# Patient Record
Sex: Female | Born: 1965 | Race: White | Hispanic: No | Marital: Single
Health system: Southern US, Community
[De-identification: ages and names within clinical notes are randomized; demographics above are authoritative.]

## PROBLEM LIST (undated history)

## (undated) DIAGNOSIS — E079 Disorder of thyroid, unspecified: Secondary | ICD-10-CM

## (undated) DIAGNOSIS — I10 Essential (primary) hypertension: Secondary | ICD-10-CM

---

## 2009-10-28 ENCOUNTER — Ambulatory Visit: Payer: Self-pay | Admitting: Radiology

## 2009-10-28 ENCOUNTER — Emergency Department (HOSPITAL_BASED_OUTPATIENT_CLINIC_OR_DEPARTMENT_OTHER): Admission: EM | Admit: 2009-10-28 | Discharge: 2009-10-28 | Payer: Self-pay | Admitting: Emergency Medicine

## 2020-04-25 ENCOUNTER — Other Ambulatory Visit: Payer: Self-pay

## 2020-07-20 ENCOUNTER — Other Ambulatory Visit: Payer: Self-pay

## 2020-07-20 ENCOUNTER — Emergency Department (HOSPITAL_BASED_OUTPATIENT_CLINIC_OR_DEPARTMENT_OTHER): Payer: Self-pay

## 2020-07-20 ENCOUNTER — Encounter (HOSPITAL_BASED_OUTPATIENT_CLINIC_OR_DEPARTMENT_OTHER): Payer: Self-pay | Admitting: Emergency Medicine

## 2020-07-20 ENCOUNTER — Emergency Department (HOSPITAL_BASED_OUTPATIENT_CLINIC_OR_DEPARTMENT_OTHER)
Admission: EM | Admit: 2020-07-20 | Discharge: 2020-07-20 | Disposition: A | Discharge status: Home / Self Care | Payer: Self-pay | Attending: Emergency Medicine | Admitting: Emergency Medicine

## 2020-07-20 DIAGNOSIS — Z79899 Other long term (current) drug therapy: Secondary | ICD-10-CM | POA: Insufficient documentation

## 2020-07-20 DIAGNOSIS — R079 Chest pain, unspecified: Secondary | ICD-10-CM | POA: Insufficient documentation

## 2020-07-20 DIAGNOSIS — H538 Other visual disturbances: Secondary | ICD-10-CM | POA: Insufficient documentation

## 2020-07-20 DIAGNOSIS — R519 Headache, unspecified: Secondary | ICD-10-CM | POA: Insufficient documentation

## 2020-07-20 DIAGNOSIS — I10 Essential (primary) hypertension: Secondary | ICD-10-CM | POA: Insufficient documentation

## 2020-07-20 HISTORY — DX: Disorder of thyroid, unspecified: E07.9

## 2020-07-20 HISTORY — DX: Essential (primary) hypertension: I10

## 2020-07-20 LAB — BASIC METABOLIC PANEL
Anion gap: 12 (ref 5–15)
BUN: 14 mg/dL (ref 6–20)
CO2: 29 mmol/L (ref 22–32)
Calcium: 9.4 mg/dL (ref 8.9–10.3)
Chloride: 95 mmol/L — ABNORMAL LOW (ref 98–111)
Creatinine, Ser: 0.75 mg/dL (ref 0.44–1.00)
GFR, Estimated: >60 mL/min (ref >60)
Glucose, Bld: 106 mg/dL — ABNORMAL HIGH (ref 70–99)
Potassium: 3.2 mmol/L — ABNORMAL LOW (ref 3.5–5.1)
Sodium: 136 mmol/L (ref 135–145)

## 2020-07-20 LAB — CBC
HCT: 41.6 % (ref 36.0–46.0)
Hemoglobin: 14.8 g/dL (ref 12.0–15.0)
MCH: 31.4 pg (ref 26.0–34.0)
MCHC: 35.6 g/dL (ref 30.0–36.0)
MCV: 88.1 fL (ref 80.0–100.0)
Platelets: 311 10*3/uL (ref 150–400)
RBC: 4.72 MIL/uL (ref 3.87–5.11)
RDW: 12.7 % (ref 11.5–15.5)
WBC: 7.8 10*3/uL (ref 4.0–10.5)
nRBC: 0 % (ref 0.0–0.2)

## 2020-07-20 LAB — TROPONIN I (HIGH SENSITIVITY)
Troponin I (High Sensitivity): 7 ng/L (ref <18)
Troponin I (High Sensitivity): 7 ng/L (ref <18)

## 2020-07-20 MED ORDER — KETOROLAC TROMETHAMINE 15 MG/ML IJ SOLN
15.0000 mg | Freq: Once | INTRAMUSCULAR | Status: AC
  Administered 2020-07-20: 15 mg via INTRAVENOUS
  Filled 2020-07-20: qty 1

## 2020-07-20 MED ORDER — ACETAMINOPHEN 500 MG PO TABS
1000.0000 mg | ORAL_TABLET | Freq: Once | ORAL | Status: AC
  Administered 2020-07-20: 1000 mg via ORAL
  Filled 2020-07-20: qty 2

## 2020-07-20 MED ORDER — DIPHENHYDRAMINE HCL 50 MG/ML IJ SOLN
12.5000 mg | Freq: Once | INTRAMUSCULAR | Status: AC
  Administered 2020-07-20: 12.5 mg via INTRAVENOUS
  Filled 2020-07-20: qty 1

## 2020-07-20 MED ORDER — PROCHLORPERAZINE EDISYLATE 10 MG/2ML IJ SOLN
10.0000 mg | Freq: Once | INTRAMUSCULAR | Status: AC
  Administered 2020-07-20: 10 mg via INTRAVENOUS
  Filled 2020-07-20: qty 2

## 2020-07-20 MED ORDER — LORAZEPAM 1 MG PO TABS
1.0000 mg | ORAL_TABLET | Freq: Once | ORAL | Status: AC
  Administered 2020-07-20: 1 mg via ORAL
  Filled 2020-07-20: qty 1

## 2020-07-20 MED ORDER — IOHEXOL 350 MG/ML SOLN
100.0000 mL | Freq: Once | INTRAVENOUS | Status: AC | PRN
  Administered 2020-07-20: 100 mL via INTRAVENOUS

## 2020-07-20 NOTE — ED Notes (Signed)
ED Provider at bedside. 

## 2020-07-20 NOTE — ED Notes (Signed)
Patient transported to CT 

## 2020-07-20 NOTE — ED Notes (Signed)
Pt also c/o headache (pressure) 8/10 x 1 week and cp x 1 week 5/10

## 2020-07-20 NOTE — ED Notes (Signed)
  Pt transported to ct 

## 2020-07-20 NOTE — ED Triage Notes (Signed)
Reports central chest pressure for the last week.  Reports having a lot of anxiety.  Also reports blurred vision today while she was working on the computer.

## 2020-07-20 NOTE — ED Notes (Signed)
Pt up to restroom.

## 2020-07-20 NOTE — ED Notes (Signed)
Pt via pov from home with blurred vision today. Pt states she has been under a large amount of stress and has had what may be panic attacks as well. Pt A&O x 4, nad noted.

## 2020-07-20 NOTE — ED Provider Notes (Signed)
Carmen Wood   CSN: [id number] Arrival date & time: 07/20/20  1501     History Chief Complaint  Patient presents with  . Chest Pain    Carmen Wood is a 55 y.o. female.  Presents to ER with concern for myriad symptoms.  She states over the past couple years that she struggled with significant anxiety associated with going through divorce process.  She reports that this seems to have been intensifying lately.  About a week ago she started having intermittent episodes of chest pain.  Occurring at rest, not associated with exertion, seem to come and go at random, tightness, nonradiating.  Currently pain is mild.  States that she also has been having intermittent dull achy headaches.  Worse in front, described as pressure.  Nonradiating.  Currently 8 out of 10 in severity.  This afternoon while she was working on her computer she started seeing black blocks in her vision field, seemed like she was having blurry vision, worse on left side.  States that this has been steadily resolving and she has had near complete return to her regular vision.  Wears prescription glasses.    HPI     Past Medical History:  Diagnosis Date  . Hypertension   . Thyroid disease     There are no problems to display for this patient.     OB History   No obstetric history on file.     No family history on file.     Home Medications Prior to Admission medications   Medication Sig Start Date End Date Taking? Authorizing Provider  atorvastatin (LIPITOR) 20 MG tablet Take 20 mg by mouth daily. 07/11/20  Yes [provider]  citalopram (CELEXA) 10 MG tablet Take by mouth. 07/18/20 10/16/20 Yes [provider]  hydrochlorothiazide (MICROZIDE) 12.5 MG capsule Take by mouth. 04/17/16  Yes [provider]  levothyroxine (SYNTHROID) 75 MCG tablet Take 75 mcg by mouth daily. 06/21/20  Yes [provider]  LORazepam (ATIVAN) 0.5 MG  tablet  07/18/20  Yes [provider]  propranolol (INDERAL) 20 MG tablet Take 20 mg by mouth daily. 07/11/20  Yes [provider]  propranolol (INDERAL) 80 MG tablet Take by mouth.   Yes [provider]    Allergies    Percocet [oxycodone-acetaminophen] and Sulfa antibiotics  Review of Systems   Review of Systems  Constitutional: Negative for chills and fever.  HENT: Negative for ear pain and sore throat.   Eyes: Negative for pain and visual disturbance.  Respiratory: Negative for cough and shortness of breath.   Cardiovascular: Positive for chest pain. Negative for palpitations.  Gastrointestinal: Negative for abdominal pain and vomiting.  Genitourinary: Negative for dysuria and hematuria.  Musculoskeletal: Negative for arthralgias and back pain.  Skin: Negative for color change and rash.  Neurological: Negative for seizures and syncope.  All other systems reviewed and are negative.   Physical Exam Updated Vital Signs BP 138/76 (BP Location: Right Arm)   Pulse 71   Temp 98.3 F (36.8 C) (Oral)   Resp 20   Ht '5\' 1"'  (1.549 m)   Wt 79.4 kg   SpO2 94%   BMI 33.07 kg/m   Physical Exam Vitals and nursing Wood reviewed.  Constitutional:      General: She is not in acute distress.    Appearance: She is well-developed and well-nourished.  HENT:     Head: Normocephalic and atraumatic.  Eyes:  Conjunctiva/sclera: Conjunctivae normal.  Cardiovascular:     Rate and Rhythm: Normal rate and regular rhythm.     Heart sounds: No murmur heard.   Pulmonary:     Effort: Pulmonary effort is normal. No respiratory distress.     Breath sounds: Normal breath sounds.  Abdominal:     Palpations: Abdomen is soft.     Tenderness: There is no abdominal tenderness.  Musculoskeletal:        General: No edema.     Cervical back: Neck supple.  Skin:    General: Skin is warm and dry.  Neurological:     Mental Status: She is alert.     Comments: AAOx3 CN  2-12 intact, speech clear visual fields intact 5/5 strength in b/l UE and LE Sensation to light touch intact in b/l UE and LE Normal FNF Normal gait  Psychiatric:        Mood and Affect: Mood and affect normal.     ED Results / Procedures / Treatments   Labs (all labs ordered are listed, but only abnormal results are displayed) Labs Reviewed  BASIC METABOLIC PANEL - Abnormal; Notable for the following components:      Result Value   Potassium 3.2 (*)    Chloride 95 (*)    Glucose, Bld 106 (*)    All other components within normal limits  CBC  TROPONIN I (HIGH SENSITIVITY)  TROPONIN I (HIGH SENSITIVITY)    EKG EKG Interpretation  Date/Time:  Friday July 20 2020 15:09:59 EST Ventricular Rate:  79 PR Interval:    QRS Duration: 91 QT Interval:  394 QTC Calculation: 452 R Axis:   81 Text Interpretation: Sinus rhythm Baseline wander in lead(s) V1 Confirmed by Madalyn Rob [phone]) on 07/20/2020 4:16:45 PM   Radiology CT Angio Head W or Wo Contrast  Result Date: 07/20/2020 CLINICAL DATA:  Neuro deficit, acute, stroke suspected severe headache, vision changes, concern for aneurysm vs stroke vs bleed EXAM: CT ANGIOGRAPHY HEAD AND NECK TECHNIQUE: Multidetector CT imaging of the head and neck was performed using the standard protocol during bolus administration of intravenous contrast. Multiplanar CT image reconstructions and MIPs were obtained to evaluate the vascular anatomy. Carotid stenosis measurements (when applicable) are obtained utilizing NASCET criteria, using the distal internal carotid diameter as the denominator. CONTRAST:  152m OMNIPAQUE IOHEXOL 350 MG/ML SOLN COMPARISON:  03/17/2010 report. FINDINGS: CT HEAD FINDINGS Brain: No acute infarct or intracranial hemorrhage. No mass lesion. No midline shift, ventriculomegaly or extra-axial fluid collection. Vascular: No hyperdense vessel or unexpected calcification. Skull: No acute finding. Sinuses/Orbits: No acute  finding. Other: None. Review of the MIP images confirms the above findings CTA NECK FINDINGS Aortic arch: 4 vessel aortic arch. Imaged portion shows no evidence of aneurysm or dissection. No significant stenosis of the major arch vessel origins. Right carotid system: Patent. Left carotid system: Patent. Vertebral arteries: Patent. Aortic origin of the left vertebral artery which is dominant. Skeleton: No acute finding. Other neck: No adenopathy.  No soft tissue mass. Upper chest: Upper lung atelectasis. Review of the MIP images confirms the above findings CTA HEAD FINDINGS Anterior circulation: Patent ICAs. Patent ACAs and anterior communicating artery. Patent MCAs. Posterior circulation: Patent V4 segments and proximal PICA. Patent basilar artery. The superior cerebellar arteries are proximally patent. Patent bilateral posterior cerebral arteries. Venous sinuses: As permitted by contrast timing, patent. Anatomic variants: The right posterior communicating artery is either diminutive or congenitally absent. Review of the MIP images confirms the above findings  IMPRESSION: No acute intracranial process. No emergent vascular finding involving the major head and neck arterial vessels. Electronically Signed   By: Primitivo Gauze M.D.   On: 07/20/2020 17:36   DG Chest 2 View  Result Date: 07/20/2020 CLINICAL DATA:  Chest pain EXAM: CHEST - 2 VIEW COMPARISON:  None. FINDINGS: The heart size and mediastinal contours are within normal limits. Both lungs are clear. The visualized skeletal structures are unremarkable. IMPRESSION: No active cardiopulmonary disease. Electronically Signed   By: Inez Catalina M.D.   On: 07/20/2020 15:45   CT Angio Neck W and/or Wo Contrast  Result Date: 07/20/2020 CLINICAL DATA:  Neuro deficit, acute, stroke suspected severe headache, vision changes, concern for aneurysm vs stroke vs bleed EXAM: CT ANGIOGRAPHY HEAD AND NECK TECHNIQUE: Multidetector CT imaging of the head and neck was  performed using the standard protocol during bolus administration of intravenous contrast. Multiplanar CT image reconstructions and MIPs were obtained to evaluate the vascular anatomy. Carotid stenosis measurements (when applicable) are obtained utilizing NASCET criteria, using the distal internal carotid diameter as the denominator. CONTRAST:  161m OMNIPAQUE IOHEXOL 350 MG/ML SOLN COMPARISON:  03/17/2010 report. FINDINGS: CT HEAD FINDINGS Brain: No acute infarct or intracranial hemorrhage. No mass lesion. No midline shift, ventriculomegaly or extra-axial fluid collection. Vascular: No hyperdense vessel or unexpected calcification. Skull: No acute finding. Sinuses/Orbits: No acute finding. Other: None. Review of the MIP images confirms the above findings CTA NECK FINDINGS Aortic arch: 4 vessel aortic arch. Imaged portion shows no evidence of aneurysm or dissection. No significant stenosis of the major arch vessel origins. Right carotid system: Patent. Left carotid system: Patent. Vertebral arteries: Patent. Aortic origin of the left vertebral artery which is dominant. Skeleton: No acute finding. Other neck: No adenopathy.  No soft tissue mass. Upper chest: Upper lung atelectasis. Review of the MIP images confirms the above findings CTA HEAD FINDINGS Anterior circulation: Patent ICAs. Patent ACAs and anterior communicating artery. Patent MCAs. Posterior circulation: Patent V4 segments and proximal PICA. Patent basilar artery. The superior cerebellar arteries are proximally patent. Patent bilateral posterior cerebral arteries. Venous sinuses: As permitted by contrast timing, patent. Anatomic variants: The right posterior communicating artery is either diminutive or congenitally absent. Review of the MIP images confirms the above findings IMPRESSION: No acute intracranial process. No emergent vascular finding involving the major head and neck arterial vessels. Electronically Signed   By: CPrimitivo GauzeM.D.   On:  07/20/2020 17:36    Procedures Procedures (including critical care time)  Medications Ordered in ED Medications  LORazepam (ATIVAN) tablet 1 mg (1 mg Oral Given 07/20/20 1629)  acetaminophen (TYLENOL) tablet 1,000 mg (1,000 mg Oral Given 07/20/20 1648)  iohexol (OMNIPAQUE) 350 MG/ML injection 100 mL (100 mLs Intravenous Contrast Given 07/20/20 1702)  prochlorperazine (COMPAZINE) injection 10 mg (10 mg Intravenous Given 07/20/20 1737)  diphenhydrAMINE (BENADRYL) injection 12.5 mg (12.5 mg Intravenous Given 07/20/20 1733)  ketorolac (TORADOL) 15 MG/ML injection 15 mg (15 mg Intravenous Given 07/20/20 1735)    ED Course  I have reviewed the triage vital signs and the nursing notes.  Pertinent labs & imaging results that were available during my care of the patient were reviewed by me and considered in my medical decision making (see chart for details).    MDM Rules/Calculators/A&P                         55year old lady presenting to ER with concern for intermittent episodes of  chest pain, headaches for 1 week and episode of blurry vision soon prior to arrival.  On exam today, she is well-appearing in no distress with normal neurologic exam.  All symptoms are resolving.  Headache improved after headache cocktail.  The EKG does not have acute ischemic change and her troponin x2 is within normal limits, doubt ACS.  CTA head and neck negative for bleed, aneurysm, stroke.  Given this finding and normal neurologic exam, low suspicion for acute CNS process at present.  Suspect tension headache versus migraine.  Episode of blurry vision, since resolved.  Recommend follow-up with primary medical doctor and ophthalmologist.   After the discussed management above, the patient was determined to be safe for discharge.  The patient was in agreement with this plan and all questions regarding their care were answered.  ED return precautions were discussed and the patient will return to the ED with any  significant worsening of condition.  Final Clinical Impression(s) / ED Diagnoses Final diagnoses:  Chest pain, unspecified type  Nonintractable headache, unspecified chronicity pattern, unspecified headache type    Rx / DC Orders ED Discharge Orders    None       Lucrezia Starch, MD 07/20/20 [phone]

## 2020-07-20 NOTE — Discharge Instructions (Signed)
Please follow-up with your primary care doctor and your regular eye doctor.  Return to ER for uncontrolled headache, worsening vision changes, numbness, weakness, chest pain or other new concerning symptom.

## 2022-04-22 ENCOUNTER — Other Ambulatory Visit: Payer: Self-pay

## 2022-04-22 DIAGNOSIS — H938X2 Other specified disorders of left ear: Secondary | ICD-10-CM | POA: Diagnosis not present

## 2022-04-22 DIAGNOSIS — R079 Chest pain, unspecified: Secondary | ICD-10-CM | POA: Diagnosis present

## 2022-04-22 DIAGNOSIS — Z79899 Other long term (current) drug therapy: Secondary | ICD-10-CM | POA: Diagnosis not present

## 2022-04-22 DIAGNOSIS — I10 Essential (primary) hypertension: Secondary | ICD-10-CM | POA: Diagnosis not present

## 2022-04-22 LAB — COMPREHENSIVE METABOLIC PANEL
ALT: 29 U/L (ref 0–44)
AST: 22 U/L (ref 15–41)
Albumin: 3.6 g/dL (ref 3.5–5.0)
Alkaline Phosphatase: 67 U/L (ref 38–126)
Anion gap: 8 (ref 5–15)
BUN: 18 mg/dL (ref 6–20)
CO2: 29 mmol/L (ref 22–32)
Calcium: 8.9 mg/dL (ref 8.9–10.3)
Chloride: 98 mmol/L (ref 98–111)
Creatinine, Ser: 0.89 mg/dL (ref 0.44–1.00)
GFR, Estimated: >60 mL/min (ref >60)
Glucose, Bld: 110 mg/dL — ABNORMAL HIGH (ref 70–99)
Potassium: 3.9 mmol/L (ref 3.5–5.1)
Sodium: 135 mmol/L (ref 135–145)
Total Bilirubin: 0.6 mg/dL (ref 0.3–1.2)
Total Protein: 6.6 g/dL (ref 6.5–8.1)

## 2022-04-22 LAB — CBC WITH DIFFERENTIAL/PLATELET
Abs Immature Granulocytes: 0.25 10*3/uL — ABNORMAL HIGH (ref 0.00–0.07)
Basophils Absolute: 0.1 10*3/uL (ref 0.0–0.1)
Basophils Relative: 1 %
Eosinophils Absolute: 0.2 10*3/uL (ref 0.0–0.5)
Eosinophils Relative: 2 %
HCT: 40.5 % (ref 36.0–46.0)
Hemoglobin: 14.1 g/dL (ref 12.0–15.0)
Immature Granulocytes: 2 %
Lymphocytes Relative: 27 %
Lymphs Abs: 2.9 10*3/uL (ref 0.7–4.0)
MCH: 30.3 pg (ref 26.0–34.0)
MCHC: 34.8 g/dL (ref 30.0–36.0)
MCV: 87.1 fL (ref 80.0–100.0)
Monocytes Absolute: 0.6 10*3/uL (ref 0.1–1.0)
Monocytes Relative: 6 %
Neutro Abs: 6.7 10*3/uL (ref 1.7–7.7)
Neutrophils Relative %: 62 %
Platelets: 386 10*3/uL (ref 150–400)
RBC: 4.65 MIL/uL (ref 3.87–5.11)
RDW: 12.4 % (ref 11.5–15.5)
WBC: 10.8 10*3/uL — ABNORMAL HIGH (ref 4.0–10.5)
nRBC: 0 % (ref 0.0–0.2)

## 2022-04-22 NOTE — ED Triage Notes (Signed)
PT reports (+) Covid in [place] 1 week ago. (-) covid test today at home.

## 2022-04-22 NOTE — ED Triage Notes (Signed)
PT reports feeling like her BP was high beginning this morning, no measurement at home. Pt reports severe lower back bilaterally starting around 5pm. Pain has been intermittent. CP beginning on arrival to ED. CP is central and constant. Pain is pressure-like. 5/10 CP. 8/10 back. Pt reports feeling hot, did not take temp at home. Denies SOB, weakness, fatigue.

## 2022-04-23 ENCOUNTER — Emergency Department (HOSPITAL_BASED_OUTPATIENT_CLINIC_OR_DEPARTMENT_OTHER)
Admission: EM | Admit: 2022-04-23 | Discharge: 2022-04-23 | Disposition: A | Discharge status: Home / Self Care | Payer: BC Managed Care – PPO | Attending: Emergency Medicine | Admitting: Emergency Medicine

## 2022-04-23 DIAGNOSIS — R079 Chest pain, unspecified: Secondary | ICD-10-CM

## 2022-04-23 DIAGNOSIS — I10 Essential (primary) hypertension: Secondary | ICD-10-CM

## 2022-04-23 DIAGNOSIS — H938X2 Other specified disorders of left ear: Secondary | ICD-10-CM

## 2022-04-23 LAB — URINALYSIS, ROUTINE W REFLEX MICROSCOPIC
Bilirubin Urine: NEGATIVE
Glucose, UA: NEGATIVE mg/dL
Hgb urine dipstick: NEGATIVE
Ketones, ur: NEGATIVE mg/dL
Leukocytes,Ua: NEGATIVE
Nitrite: NEGATIVE
Protein, ur: NEGATIVE mg/dL
Specific Gravity, Urine: 1.015 (ref 1.005–1.030)
pH: 7 (ref 5.0–8.0)

## 2022-04-23 LAB — TROPONIN I (HIGH SENSITIVITY)
Troponin I (High Sensitivity): 8 ng/L (ref <18)
Troponin I (High Sensitivity): 8 ng/L (ref <18)

## 2022-04-23 MED ORDER — IBUPROFEN 400 MG PO TABS
400.0000 mg | ORAL_TABLET | Freq: Once | ORAL | Status: AC | PRN
  Administered 2022-04-23: 400 mg via ORAL
  Filled 2022-04-23: qty 1

## 2022-04-23 NOTE — Discharge Instructions (Signed)
Please check your blood pressure once a day.  Keep a record of it and take that record with you when you see your primary care provider.  For your clogged ear, try using Afrin nasal spray.  Please be aware that Afrin is only safe to be used for 3 days.  You may take acetaminophen and/or ibuprofen as needed for pain.  Return if you have any new or concerning symptoms.

## 2022-04-23 NOTE — ED Provider Notes (Signed)
[facility] Provider Note   CSN: [id number] Arrival date & time: 04/22/22  2220     History  Chief Complaint  Patient presents with   Chest Pain    Carmen Wood is a 56 y.o. female.  The history is provided by the patient.  Chest Pain She has history of hypertension and comes in because her blood pressure was elevated at home.  She got a warm, flushed feeling in her face and states that her blood pressure is usually elevated when that happens.  She took her blood pressure and it was 169/120.  She denies headache, dyspnea.  She did develop a tight feeling in her chest as she arrived in the emergency department, but that resolved spontaneously.  There is no associated dyspnea, nausea, diaphoresis.  She also had an episode of sharp pain in her lower back which also resolved spontaneously.  She has several other complaints.  She just flew back from [place] and her left ear still feels like it is clogged.  She is complaining of pain in her right upper gum.  She also still has a scratchy throat which is related to a recent bout with COVID-19.   Home Medications Prior to Admission medications   Medication Sig Start Date End Date Taking? Authorizing Provider  atorvastatin (LIPITOR) 20 MG tablet Take 20 mg by mouth daily. 07/11/20   [provider]  citalopram (CELEXA) 10 MG tablet Take by mouth. 07/18/20 10/16/20  [provider]  hydrochlorothiazide (MICROZIDE) 12.5 MG capsule Take by mouth. 04/17/16   [provider]  levothyroxine (SYNTHROID) 75 MCG tablet Take 75 mcg by mouth daily. 06/21/20   [provider]  LORazepam (ATIVAN) 0.5 MG tablet  07/18/20   [provider]  propranolol (INDERAL) 20 MG tablet Take 20 mg by mouth daily. 07/11/20   [provider]  propranolol (INDERAL) 80 MG tablet Take by mouth.    [provider]      Allergies    Percocet [oxycodone-acetaminophen] and Sulfa  antibiotics    Review of Systems   Review of Systems  Cardiovascular:  Positive for chest pain.  All other systems reviewed and are negative.   Physical Exam Updated Vital Signs BP 126/74 (BP Location: Right Arm)   Pulse (!) 56   Temp (!) 97.5 F (36.4 C) (Oral)   Resp 18   Ht 5\' 1"  (1.549 m)   Wt 77.1 kg   SpO2 97%   BMI 32.12 kg/m  Physical Exam Vitals and nursing note reviewed.   56 year old female, resting comfortably and in no acute distress. Vital signs are significant for initial elevated blood pressure which has returned to normal with simple observation, slightly slow heart rate. Oxygen saturation is 97%, which is normal. Head is normocephalic and atraumatic. PERRLA, EOMI. Oropharynx is clear.  Gingiva appear normal.  Tympanic membranes are clear. Neck is nontender and supple without adenopathy or JVD. Back is nontender and there is no CVA tenderness. Lungs are clear without rales, wheezes, or rhonchi. Chest is nontender. Heart has regular rate and rhythm without murmur. Abdomen is soft, flat, nontender. Extremities have no cyanosis or edema, full range of motion is present. Skin is warm and dry without rash. Neurologic: Mental status is normal, cranial nerves are intact, moves all extremities equally.  ED Results / Procedures / Treatments   Labs (all labs ordered are listed, but only abnormal results are displayed) Labs Reviewed  CBC WITH DIFFERENTIAL/PLATELET - Abnormal;  Notable for the following components:      Result Value   WBC 10.8 (*)    Abs Immature Granulocytes 0.25 (*)    All other components within normal limits  COMPREHENSIVE METABOLIC PANEL - Abnormal; Notable for the following components:   Glucose, Bld 110 (*)    All other components within normal limits  URINALYSIS, ROUTINE W REFLEX MICROSCOPIC - Abnormal; Notable for the following components:   APPearance HAZY (*)    All other components within normal limits  TROPONIN I (HIGH SENSITIVITY)   TROPONIN I (HIGH SENSITIVITY)    EKG EKG Interpretation  Date/Time:  Tuesday April 22 2022 22:54:58 EDT Ventricular Rate:  75 PR Interval:  134 QRS Duration: 80 QT Interval:  392 QTC Calculation: 437 R Axis:   78 Text Interpretation: Normal sinus rhythm Normal ECG When compared with ECG of 20-Jul-2020 15:09, No significant change was found Confirmed by Delora Fuel ([id number]) on 04/23/2022 4:21:39 AM  Procedures Procedures    Medications Ordered in ED Medications  ibuprofen (ADVIL) tablet 400 mg (400 mg Oral Given 04/23/22 0054)    ED Course/ Medical Decision Making/ A&P                           Medical Decision Making Amount and/or Complexity of Data Reviewed Labs: ordered.  Risk Prescription drug management.   Elevated blood pressure which has returned to normal spontaneously.  Episode of chest pain of uncertain cause.  Doubt ACS, pulmonary embolism, pneumonia, pleurisy.  I have reviewed and interpreted her electrocardiogram and my interpretation is normal electrocardiogram.  I have reviewed and interpreted her laboratory tests and my interpretation is minimal leukocytosis which is nonspecific, otherwise normal CBC.  Slight elevation of random glucose and otherwise normal comprehensive metabolic panel.  Normal troponin x2.  Normal urinalysis.  Heart score is 2, which puts her at low risk for major adverse cardiac events in the next 6 weeks.  Patient is advised of this finding.  Cause of her back pain is unclear, but I do not see indication for further investigation into this.  Cause for her gum pain is unclear, no abnormal findings on exam.  Regarding sense that her ear is clogged, this is likely related to eustachian tube malfunction and recent airline flight.  I have recommended she use over-the-counter oxy metolazone with the provision that she not use it for more than 3 days.  I have reassured her that her blood pressure has returned to normal, I have encouraged her to  monitor her blood pressure daily at home.  Final Clinical Impression(s) / ED Diagnoses Final diagnoses:  Elevated blood pressure reading with diagnosis of hypertension  Nonspecific chest pain  Clogged ear, left    Rx / DC Orders ED Discharge Orders     None         Delora Fuel, MD [phone]

## 2022-04-30 IMAGING — DX DG CHEST 2V
2 series · 2 of 2 positions shown · non-contrast
Comparison: None.

CLINICAL DATA: Chest pain

EXAM:
CHEST - 2 VIEW

[chest pa]
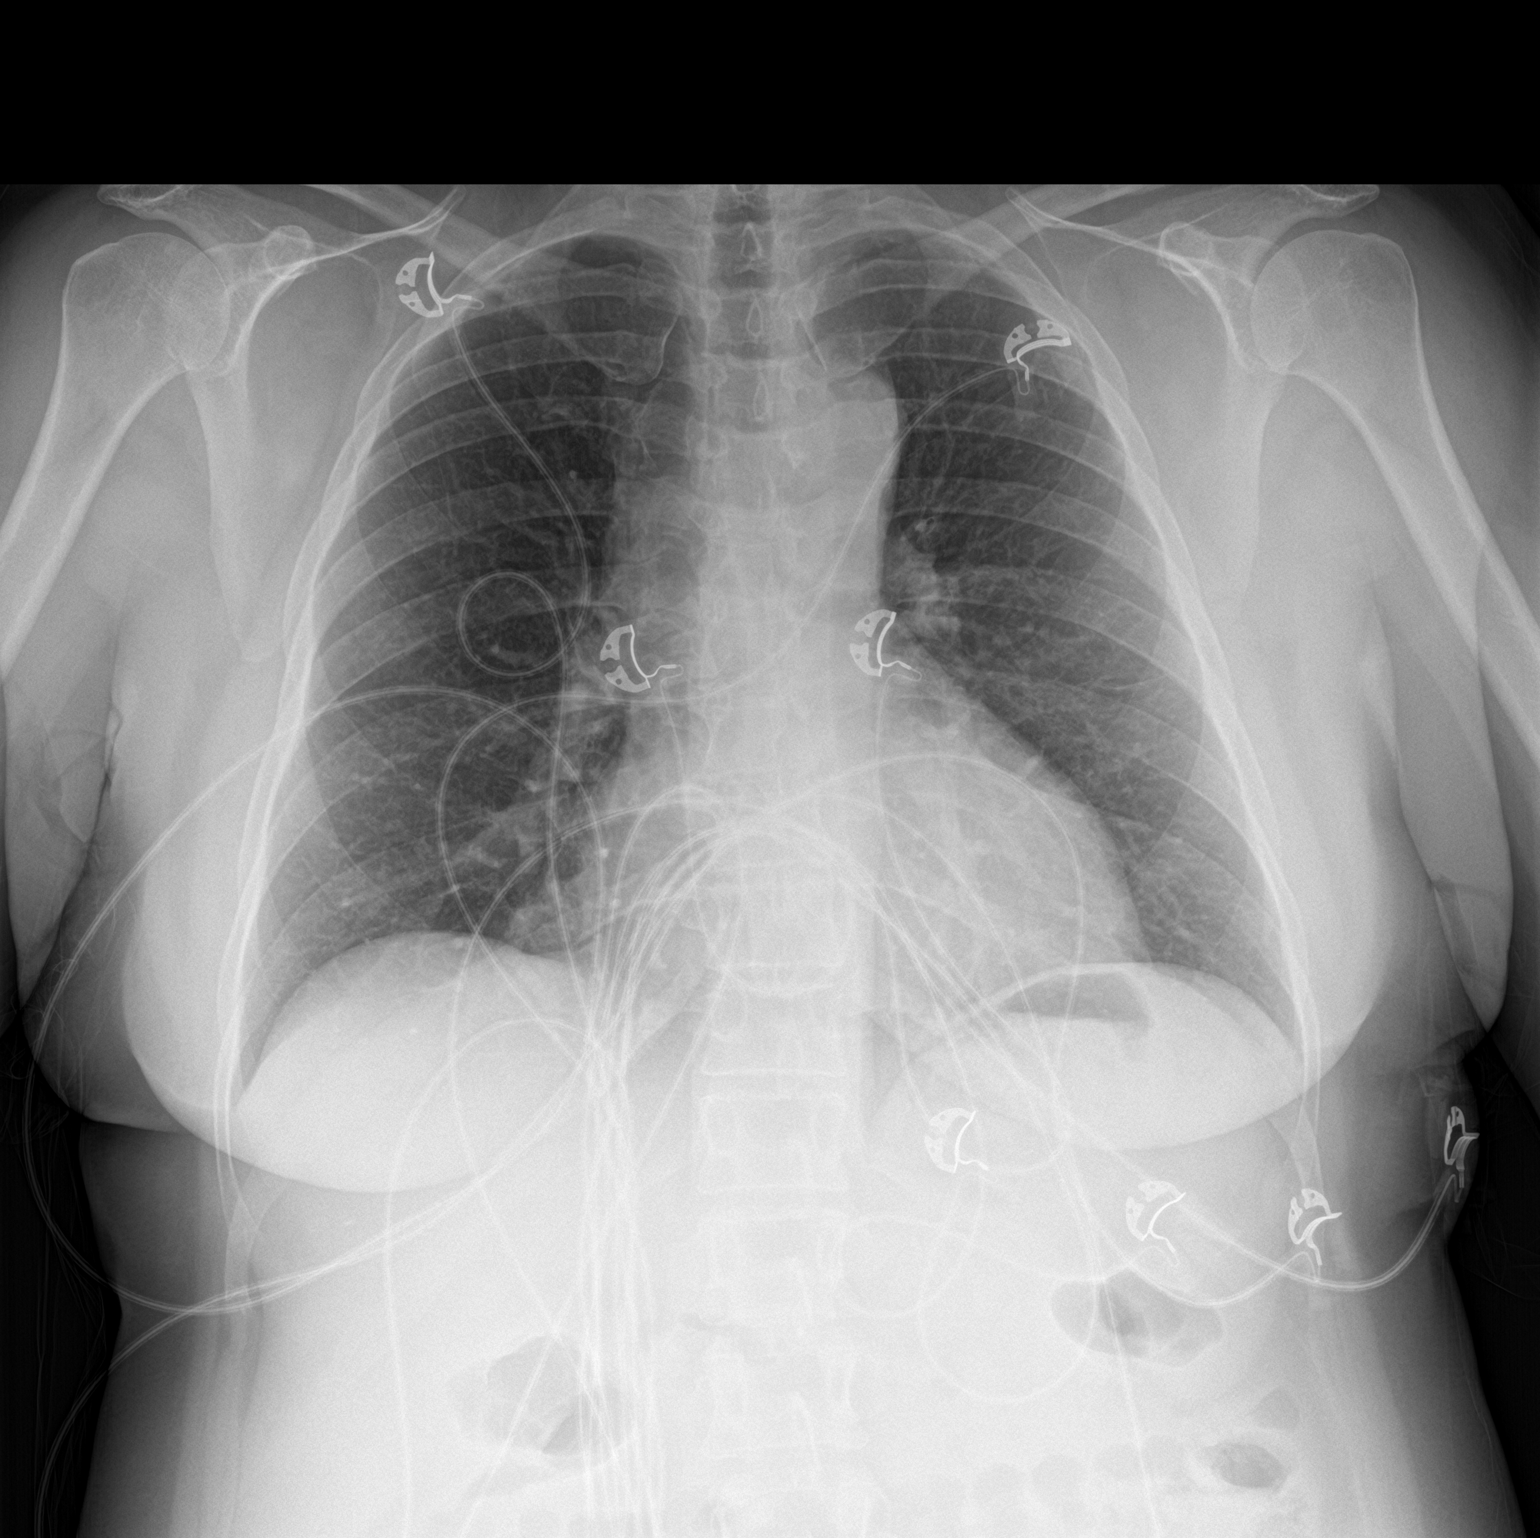

[chest lat]
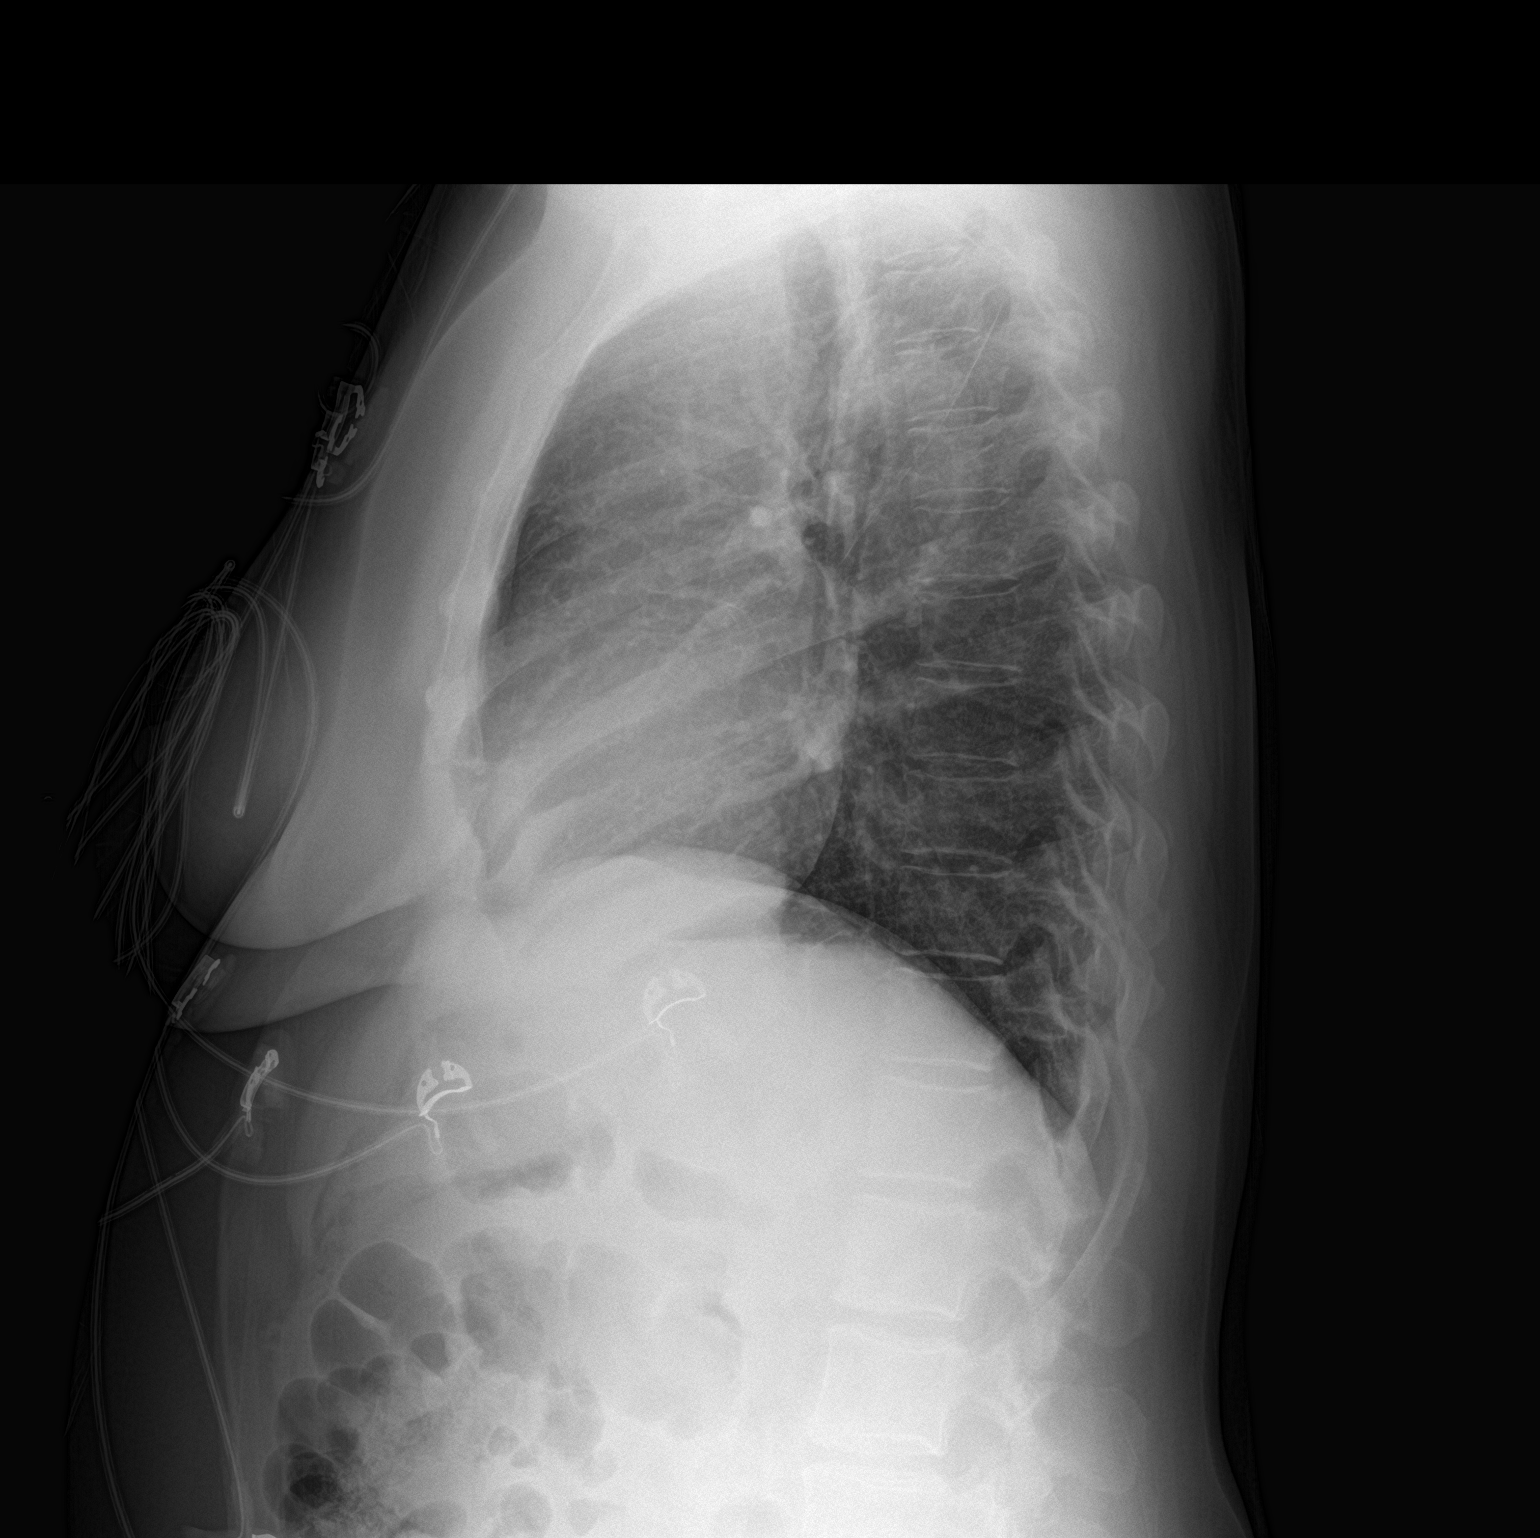

[2 of 2 positions shown; findings below may reference images not displayed]

FINDINGS: The heart size and mediastinal contours are within normal limits.
Both lungs are clear. The visualized skeletal structures are
unremarkable.
IMPRESSION: No active cardiopulmonary disease.

## 2022-04-30 IMAGING — CT CT ANGIO HEAD
1 of 10 series · 6 of 33 positions shown · IV contrast (omnipaque)
Comparison: 03/17/2010 report.

CLINICAL DATA: Neuro deficit, acute, stroke suspected severe
headache, vision changes, concern for aneurysm vs stroke vs bleed

EXAM:
CT ANGIOGRAPHY HEAD AND NECK
TECHNIQUE: Multidetector CT imaging of the head and neck was performed using
the standard protocol during bolus administration of intravenous
contrast. Multiplanar CT image reconstructions and MIPs were
obtained to evaluate the vascular anatomy. Carotid stenosis
measurements (when applicable) are obtained utilizing NASCET
criteria, using the distal internal carotid diameter as the
denominator.
CONTRAST:  100mL OMNIPAQUE IOHEXOL 350 MG/ML SOLN

[Series 510: axial thin · axial · 0.46mm/px · z∈[+917,+1146]mm · 6 of 324 slices shown]
[im 47/324  soft-tissue]
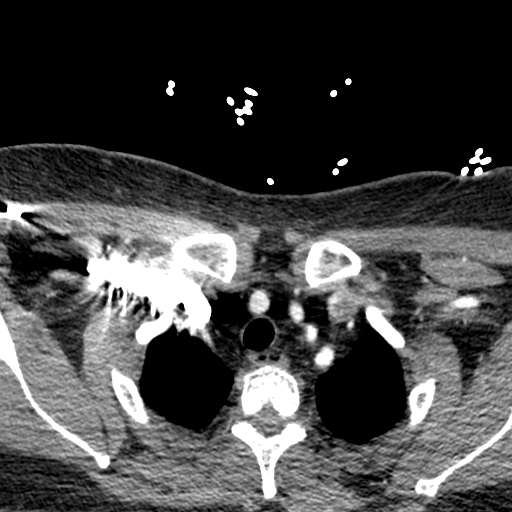
[im 93/324  bone]
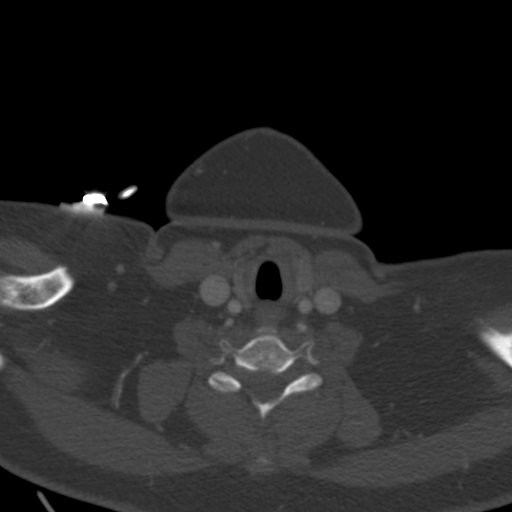
[im 139/324  soft-tissue]
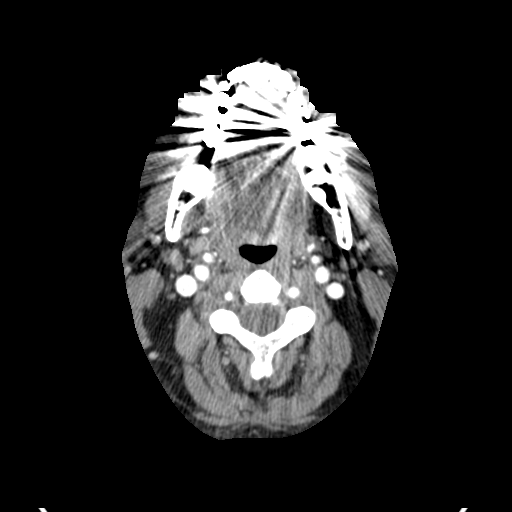
[im 185/324  bone]
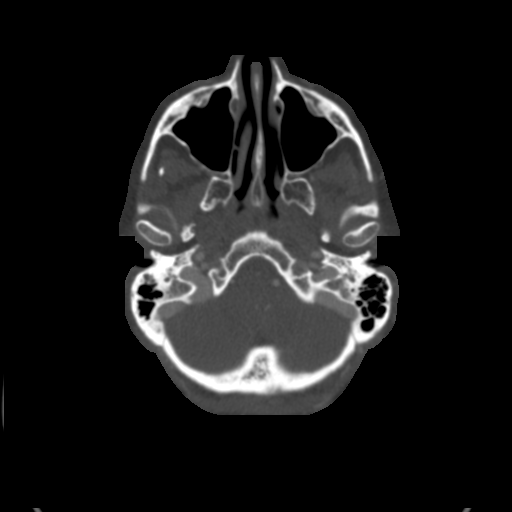
[im 231/324  soft-tissue]
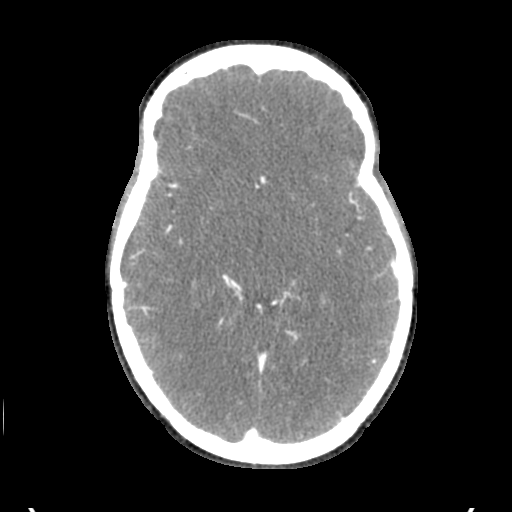
[im 277/324  bone]
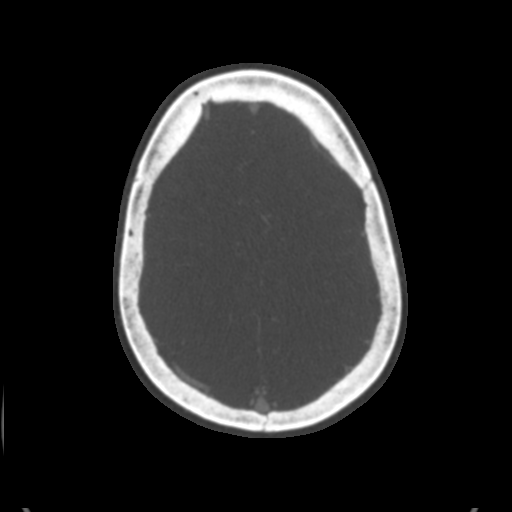

[6 of 33 positions shown; findings below may reference images not displayed]

FINDINGS: CT HEAD FINDINGS

Brain: No acute infarct or intracranial hemorrhage. No mass lesion.
No midline shift, ventriculomegaly or extra-axial fluid collection.

Vascular: No hyperdense vessel or unexpected calcification.

Skull: No acute finding.

Sinuses/Orbits: No acute finding.

Other: None.

Review of the MIP images confirms the above findings

CTA NECK FINDINGS

Aortic arch: 4 vessel aortic arch. Imaged portion shows no evidence
of aneurysm or dissection. No significant stenosis of the major arch
vessel origins.

Right carotid system: Patent.

Left carotid system: Patent.

Vertebral arteries: Patent. Aortic origin of the left vertebral
artery which is dominant.

Skeleton: No acute finding.

Other neck: No adenopathy.  No soft tissue mass.

Upper chest: Upper lung atelectasis.

Review of the MIP images confirms the above findings

CTA HEAD FINDINGS

Anterior circulation: Patent ICAs. Patent ACAs and anterior
communicating artery. Patent MCAs.

Posterior circulation: Patent V4 segments and proximal PICA. Patent
basilar artery. The superior cerebellar arteries are proximally
patent. Patent bilateral posterior cerebral arteries.

Venous sinuses: As permitted by contrast timing, patent.

Anatomic variants: The right posterior communicating artery is
either diminutive or congenitally absent.

Review of the MIP images confirms the above findings
IMPRESSION: No acute intracranial process.

No emergent vascular finding involving the major head and neck
arterial vessels.

## 2022-11-10 ENCOUNTER — Other Ambulatory Visit (HOSPITAL_BASED_OUTPATIENT_CLINIC_OR_DEPARTMENT_OTHER): Payer: Self-pay

## 2022-11-11 ENCOUNTER — Other Ambulatory Visit (HOSPITAL_BASED_OUTPATIENT_CLINIC_OR_DEPARTMENT_OTHER): Payer: Self-pay

## 2022-11-11 MED ORDER — ZEPBOUND 7.5 MG/0.5ML ~~LOC~~ SOAJ
7.5000 mg | SUBCUTANEOUS | 2 refills | Status: AC
  Filled 2022-11-11: qty 2, 28d supply, fill #0

## 2022-11-12 ENCOUNTER — Other Ambulatory Visit (HOSPITAL_BASED_OUTPATIENT_CLINIC_OR_DEPARTMENT_OTHER): Payer: Self-pay

## 2022-11-12 MED ORDER — ZEPBOUND 10 MG/0.5ML ~~LOC~~ SOAJ
10.0000 mg | SUBCUTANEOUS | 3 refills | Status: AC
  Filled 2022-11-12: qty 2, 28d supply, fill #0
  Filled 2022-12-01 – 2022-12-04 (×2): qty 2, 28d supply, fill #1
  Filled 2023-01-14: qty 2, 28d supply, fill #2
  Filled 2023-02-03 – 2023-04-03 (×2): qty 2, 28d supply, fill #3

## 2022-12-01 ENCOUNTER — Other Ambulatory Visit (HOSPITAL_BASED_OUTPATIENT_CLINIC_OR_DEPARTMENT_OTHER): Payer: Self-pay

## 2022-12-02 ENCOUNTER — Other Ambulatory Visit (HOSPITAL_BASED_OUTPATIENT_CLINIC_OR_DEPARTMENT_OTHER): Payer: Self-pay

## 2022-12-02 MED ORDER — ZEPBOUND 10 MG/0.5ML ~~LOC~~ SOAJ
10.0000 mg | SUBCUTANEOUS | 3 refills | Status: AC
  Filled 2022-12-02 – 2023-01-12 (×2): qty 2, 28d supply, fill #0

## 2022-12-04 ENCOUNTER — Other Ambulatory Visit (HOSPITAL_BASED_OUTPATIENT_CLINIC_OR_DEPARTMENT_OTHER): Payer: Self-pay

## 2022-12-09 ENCOUNTER — Other Ambulatory Visit (HOSPITAL_BASED_OUTPATIENT_CLINIC_OR_DEPARTMENT_OTHER): Payer: Self-pay

## 2023-01-12 ENCOUNTER — Other Ambulatory Visit (HOSPITAL_BASED_OUTPATIENT_CLINIC_OR_DEPARTMENT_OTHER): Payer: Self-pay

## 2023-01-12 ENCOUNTER — Other Ambulatory Visit: Payer: Self-pay

## 2023-01-14 ENCOUNTER — Other Ambulatory Visit (HOSPITAL_BASED_OUTPATIENT_CLINIC_OR_DEPARTMENT_OTHER): Payer: Self-pay

## 2023-02-03 ENCOUNTER — Other Ambulatory Visit (HOSPITAL_BASED_OUTPATIENT_CLINIC_OR_DEPARTMENT_OTHER): Payer: Self-pay

## 2023-02-04 ENCOUNTER — Other Ambulatory Visit (HOSPITAL_BASED_OUTPATIENT_CLINIC_OR_DEPARTMENT_OTHER): Payer: Self-pay

## 2023-02-04 MED ORDER — ZEPBOUND 10 MG/0.5ML ~~LOC~~ SOAJ
10.0000 mg | SUBCUTANEOUS | 1 refills | Status: DC
  Filled 2023-02-04: qty 2, 28d supply, fill #0
  Filled 2023-02-05: qty 6, 84d supply, fill #0
  Filled 2023-02-09: qty 2, 28d supply, fill #0
  Filled 2023-03-06: qty 2, 28d supply, fill #1

## 2023-02-05 ENCOUNTER — Other Ambulatory Visit (HOSPITAL_BASED_OUTPATIENT_CLINIC_OR_DEPARTMENT_OTHER): Payer: Self-pay

## 2023-02-09 ENCOUNTER — Other Ambulatory Visit (HOSPITAL_BASED_OUTPATIENT_CLINIC_OR_DEPARTMENT_OTHER): Payer: Self-pay

## 2023-03-06 ENCOUNTER — Other Ambulatory Visit (HOSPITAL_BASED_OUTPATIENT_CLINIC_OR_DEPARTMENT_OTHER): Payer: Self-pay

## 2023-03-06 ENCOUNTER — Other Ambulatory Visit: Payer: Self-pay

## 2023-04-03 ENCOUNTER — Other Ambulatory Visit (HOSPITAL_BASED_OUTPATIENT_CLINIC_OR_DEPARTMENT_OTHER): Payer: Self-pay

## 2023-04-06 ENCOUNTER — Other Ambulatory Visit (HOSPITAL_BASED_OUTPATIENT_CLINIC_OR_DEPARTMENT_OTHER): Payer: Self-pay

## 2023-04-07 ENCOUNTER — Other Ambulatory Visit (HOSPITAL_BASED_OUTPATIENT_CLINIC_OR_DEPARTMENT_OTHER): Payer: Self-pay

## 2023-04-07 MED ORDER — ZEPBOUND 10 MG/0.5ML ~~LOC~~ SOAJ
10.0000 mg | SUBCUTANEOUS | 1 refills | Status: AC
  Filled 2023-04-07 – 2023-05-01 (×2): qty 2, 28d supply, fill #0
  Filled 2023-05-26: qty 2, 28d supply, fill #1
  Filled 2023-06-28: qty 2, 28d supply, fill #2
  Filled 2023-08-01: qty 2, 28d supply, fill #3
  Filled 2023-08-26: qty 2, 28d supply, fill #4
  Filled 2023-12-16: qty 2, 28d supply, fill #5

## 2023-05-01 ENCOUNTER — Other Ambulatory Visit: Payer: Self-pay

## 2023-05-01 ENCOUNTER — Other Ambulatory Visit (HOSPITAL_BASED_OUTPATIENT_CLINIC_OR_DEPARTMENT_OTHER): Payer: Self-pay

## 2023-05-08 ENCOUNTER — Other Ambulatory Visit (HOSPITAL_BASED_OUTPATIENT_CLINIC_OR_DEPARTMENT_OTHER): Payer: Self-pay

## 2023-05-26 ENCOUNTER — Other Ambulatory Visit (HOSPITAL_BASED_OUTPATIENT_CLINIC_OR_DEPARTMENT_OTHER): Payer: Self-pay

## 2023-06-29 ENCOUNTER — Other Ambulatory Visit (HOSPITAL_BASED_OUTPATIENT_CLINIC_OR_DEPARTMENT_OTHER): Payer: Self-pay

## 2023-07-03 ENCOUNTER — Other Ambulatory Visit (HOSPITAL_BASED_OUTPATIENT_CLINIC_OR_DEPARTMENT_OTHER): Payer: Self-pay

## 2023-07-03 MED ORDER — LEVOTHYROXINE SODIUM 100 MCG PO TABS
100.0000 ug | ORAL_TABLET | Freq: Every day | ORAL | 1 refills | Status: DC
  Filled 2023-07-03: qty 30, 30d supply, fill #0

## 2023-07-03 MED ORDER — LORAZEPAM 0.5 MG PO TABS
0.5000 mg | ORAL_TABLET | Freq: Three times a day (TID) | ORAL | 0 refills | Status: DC | PRN
  Filled 2023-07-03: qty 30, 10d supply, fill #0

## 2023-07-08 ENCOUNTER — Other Ambulatory Visit (HOSPITAL_BASED_OUTPATIENT_CLINIC_OR_DEPARTMENT_OTHER): Payer: Self-pay

## 2023-08-03 ENCOUNTER — Other Ambulatory Visit (HOSPITAL_BASED_OUTPATIENT_CLINIC_OR_DEPARTMENT_OTHER): Payer: Self-pay

## 2023-08-26 ENCOUNTER — Other Ambulatory Visit (HOSPITAL_BASED_OUTPATIENT_CLINIC_OR_DEPARTMENT_OTHER): Payer: Self-pay

## 2023-09-08 ENCOUNTER — Other Ambulatory Visit: Payer: Self-pay | Admitting: Family Medicine

## 2023-09-08 ENCOUNTER — Other Ambulatory Visit (HOSPITAL_BASED_OUTPATIENT_CLINIC_OR_DEPARTMENT_OTHER): Payer: Self-pay

## 2023-09-08 MED ORDER — ZEPBOUND 10 MG/0.5ML ~~LOC~~ SOAJ
10.0000 mg | SUBCUTANEOUS | 0 refills | Status: AC
  Filled 2023-09-08 – 2023-09-25 (×2): qty 2, 28d supply, fill #0
  Filled 2023-10-15 – 2023-10-19 (×2): qty 2, 28d supply, fill #1
  Filled 2024-05-04: qty 2, 28d supply, fill #2

## 2023-09-25 ENCOUNTER — Other Ambulatory Visit (HOSPITAL_BASED_OUTPATIENT_CLINIC_OR_DEPARTMENT_OTHER): Payer: Self-pay

## 2023-10-15 ENCOUNTER — Other Ambulatory Visit (HOSPITAL_BASED_OUTPATIENT_CLINIC_OR_DEPARTMENT_OTHER): Payer: Self-pay

## 2023-11-17 ENCOUNTER — Other Ambulatory Visit (HOSPITAL_BASED_OUTPATIENT_CLINIC_OR_DEPARTMENT_OTHER): Payer: Self-pay

## 2023-11-17 MED ORDER — ZEPBOUND 10 MG/0.5ML ~~LOC~~ SOAJ
10.0000 mg | SUBCUTANEOUS | 0 refills | Status: DC
  Filled 2023-11-17: qty 2, 28d supply, fill #0
  Filled 2023-12-16: qty 2, 28d supply, fill #1

## 2023-12-16 ENCOUNTER — Other Ambulatory Visit: Payer: Self-pay

## 2023-12-16 ENCOUNTER — Other Ambulatory Visit (HOSPITAL_BASED_OUTPATIENT_CLINIC_OR_DEPARTMENT_OTHER): Payer: Self-pay

## 2024-01-08 ENCOUNTER — Other Ambulatory Visit (HOSPITAL_BASED_OUTPATIENT_CLINIC_OR_DEPARTMENT_OTHER): Payer: Self-pay

## 2024-01-08 MED ORDER — ZEPBOUND 12.5 MG/0.5ML ~~LOC~~ SOAJ
12.5000 mg | SUBCUTANEOUS | 5 refills | Status: AC
  Filled 2024-01-08 – 2024-01-12 (×2): qty 2, 28d supply, fill #0
  Filled 2024-02-03: qty 2, 28d supply, fill #1
  Filled 2024-03-09: qty 2, 28d supply, fill #2
  Filled 2024-04-05: qty 2, 28d supply, fill #3
  Filled 2024-05-04: qty 2, 28d supply, fill #4

## 2024-01-12 ENCOUNTER — Other Ambulatory Visit: Payer: Self-pay

## 2024-01-12 ENCOUNTER — Other Ambulatory Visit (HOSPITAL_BASED_OUTPATIENT_CLINIC_OR_DEPARTMENT_OTHER): Payer: Self-pay

## 2024-01-15 ENCOUNTER — Other Ambulatory Visit (HOSPITAL_BASED_OUTPATIENT_CLINIC_OR_DEPARTMENT_OTHER): Payer: Self-pay

## 2024-02-03 ENCOUNTER — Other Ambulatory Visit (HOSPITAL_BASED_OUTPATIENT_CLINIC_OR_DEPARTMENT_OTHER): Payer: Self-pay

## 2024-05-04 ENCOUNTER — Other Ambulatory Visit (HOSPITAL_BASED_OUTPATIENT_CLINIC_OR_DEPARTMENT_OTHER): Payer: Self-pay

## 2024-05-06 ENCOUNTER — Other Ambulatory Visit (HOSPITAL_BASED_OUTPATIENT_CLINIC_OR_DEPARTMENT_OTHER): Payer: Self-pay

## 2024-05-09 ENCOUNTER — Other Ambulatory Visit (HOSPITAL_BASED_OUTPATIENT_CLINIC_OR_DEPARTMENT_OTHER): Payer: Self-pay

## 2024-05-10 ENCOUNTER — Other Ambulatory Visit (HOSPITAL_BASED_OUTPATIENT_CLINIC_OR_DEPARTMENT_OTHER): Payer: Self-pay

## 2024-05-10 MED ORDER — ZEPBOUND 12.5 MG/0.5ML ~~LOC~~ SOAJ
SUBCUTANEOUS | 5 refills | Status: AC
  Filled 2024-05-10 (×2): qty 2, 28d supply, fill #0
  Filled 2024-05-31 – 2024-06-01 (×2): qty 2, 28d supply, fill #1
  Filled 2024-07-04: qty 2, 28d supply, fill #2
  Filled 2024-08-04: qty 2, 28d supply, fill #3

## 2024-05-11 ENCOUNTER — Other Ambulatory Visit: Payer: Self-pay

## 2024-05-31 ENCOUNTER — Other Ambulatory Visit (HOSPITAL_BASED_OUTPATIENT_CLINIC_OR_DEPARTMENT_OTHER): Payer: Self-pay

## 2024-06-01 ENCOUNTER — Other Ambulatory Visit (HOSPITAL_BASED_OUTPATIENT_CLINIC_OR_DEPARTMENT_OTHER): Payer: Self-pay

## 2024-06-02 ENCOUNTER — Other Ambulatory Visit (HOSPITAL_COMMUNITY): Payer: Self-pay

## 2024-07-04 ENCOUNTER — Other Ambulatory Visit (HOSPITAL_BASED_OUTPATIENT_CLINIC_OR_DEPARTMENT_OTHER): Payer: Self-pay

## 2024-08-04 ENCOUNTER — Other Ambulatory Visit (HOSPITAL_BASED_OUTPATIENT_CLINIC_OR_DEPARTMENT_OTHER): Payer: Self-pay
